# Patient Record
Sex: Female | Born: 1945 | Race: White | Hispanic: No | Smoking: Never smoker
Health system: Southern US, Community
[De-identification: ages and names within clinical notes are randomized; demographics above are authoritative.]

## PROBLEM LIST (undated history)

## (undated) DIAGNOSIS — E119 Type 2 diabetes mellitus without complications: Secondary | ICD-10-CM

## (undated) DIAGNOSIS — A809 Acute poliomyelitis, unspecified: Secondary | ICD-10-CM

## (undated) DIAGNOSIS — M1990 Unspecified osteoarthritis, unspecified site: Secondary | ICD-10-CM

## (undated) DIAGNOSIS — I10 Essential (primary) hypertension: Secondary | ICD-10-CM

---

## 1996-10-17 HISTORY — PX: BREAST SURGERY: SHX581

## 2000-02-15 ENCOUNTER — Other Ambulatory Visit: Admission: RE | Admit: 2000-02-15 | Discharge: 2000-02-15 | Payer: Self-pay | Admitting: Family Medicine

## 2000-09-05 ENCOUNTER — Encounter: Admission: RE | Admit: 2000-09-05 | Discharge: 2000-09-05 | Payer: Self-pay | Admitting: Family Medicine

## 2000-09-05 ENCOUNTER — Encounter: Payer: Self-pay | Admitting: Family Medicine

## 2001-05-14 ENCOUNTER — Other Ambulatory Visit: Admission: RE | Admit: 2001-05-14 | Discharge: 2001-05-14 | Payer: Self-pay | Admitting: Family Medicine

## 2001-08-11 ENCOUNTER — Encounter: Payer: Self-pay | Admitting: Emergency Medicine

## 2001-08-12 ENCOUNTER — Inpatient Hospital Stay (HOSPITAL_COMMUNITY): Admission: EM | Admit: 2001-08-12 | Discharge: 2001-08-12 | Payer: Self-pay | Admitting: Emergency Medicine

## 2002-09-09 ENCOUNTER — Other Ambulatory Visit: Admission: RE | Admit: 2002-09-09 | Discharge: 2002-09-09 | Payer: Self-pay | Admitting: Family Medicine

## 2003-09-26 ENCOUNTER — Other Ambulatory Visit: Admission: RE | Admit: 2003-09-26 | Discharge: 2003-09-26 | Payer: Self-pay | Admitting: Family Medicine

## 2004-11-18 ENCOUNTER — Other Ambulatory Visit: Admission: RE | Admit: 2004-11-18 | Discharge: 2004-11-18 | Payer: Self-pay | Admitting: Family Medicine

## 2004-12-27 ENCOUNTER — Ambulatory Visit (HOSPITAL_COMMUNITY): Admission: RE | Admit: 2004-12-27 | Discharge: 2004-12-27 | Payer: Self-pay | Admitting: Surgery

## 2004-12-27 ENCOUNTER — Encounter (INDEPENDENT_AMBULATORY_CARE_PROVIDER_SITE_OTHER): Payer: Self-pay | Admitting: *Deleted

## 2004-12-27 ENCOUNTER — Ambulatory Visit (HOSPITAL_BASED_OUTPATIENT_CLINIC_OR_DEPARTMENT_OTHER): Admission: RE | Admit: 2004-12-27 | Discharge: 2004-12-27 | Payer: Self-pay | Admitting: Surgery

## 2005-06-02 ENCOUNTER — Emergency Department (HOSPITAL_COMMUNITY): Admission: EM | Admit: 2005-06-02 | Discharge: 2005-06-02 | Payer: Self-pay | Admitting: Emergency Medicine

## 2005-12-01 ENCOUNTER — Other Ambulatory Visit: Admission: RE | Admit: 2005-12-01 | Discharge: 2005-12-01 | Payer: Self-pay | Admitting: Family Medicine

## 2007-02-27 ENCOUNTER — Encounter: Admission: RE | Admit: 2007-02-27 | Discharge: 2007-02-27 | Payer: Self-pay | Admitting: Family Medicine

## 2007-03-20 ENCOUNTER — Encounter: Admission: RE | Admit: 2007-03-20 | Discharge: 2007-03-20 | Payer: Self-pay | Admitting: Family Medicine

## 2007-03-29 ENCOUNTER — Encounter: Admission: RE | Admit: 2007-03-29 | Discharge: 2007-03-29 | Payer: Self-pay | Admitting: Family Medicine

## 2007-04-01 ENCOUNTER — Encounter: Admission: RE | Admit: 2007-04-01 | Discharge: 2007-04-01 | Payer: Self-pay | Admitting: Family Medicine

## 2007-05-14 ENCOUNTER — Other Ambulatory Visit: Admission: RE | Admit: 2007-05-14 | Discharge: 2007-05-14 | Payer: Self-pay | Admitting: Family Medicine

## 2008-05-26 IMAGING — CR DG KNEE 1-2V*R*
2 series · 2 of 2 positions shown · non-contrast
Comparison: None.

Exam: Right knee, 2 views.

HISTORY: Right knee pain.

[view not recorded (1 of 2)]
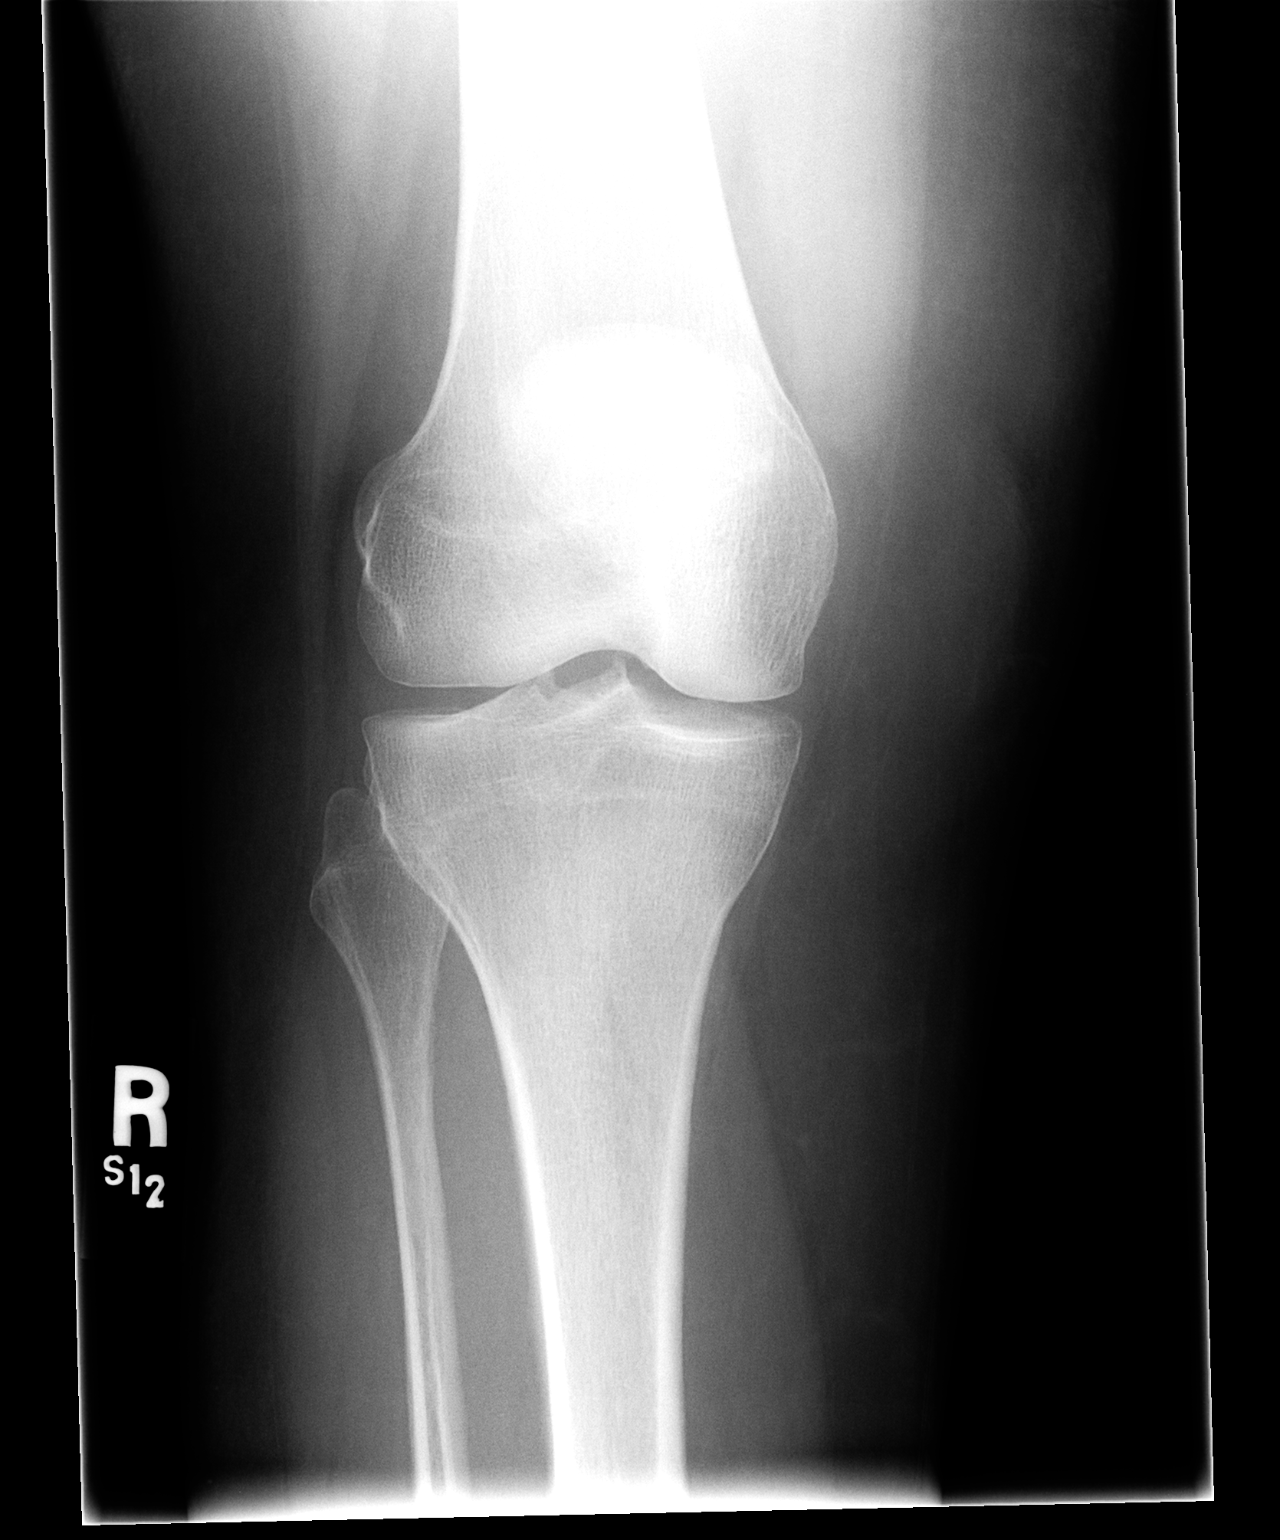

[view not recorded (2 of 2)]
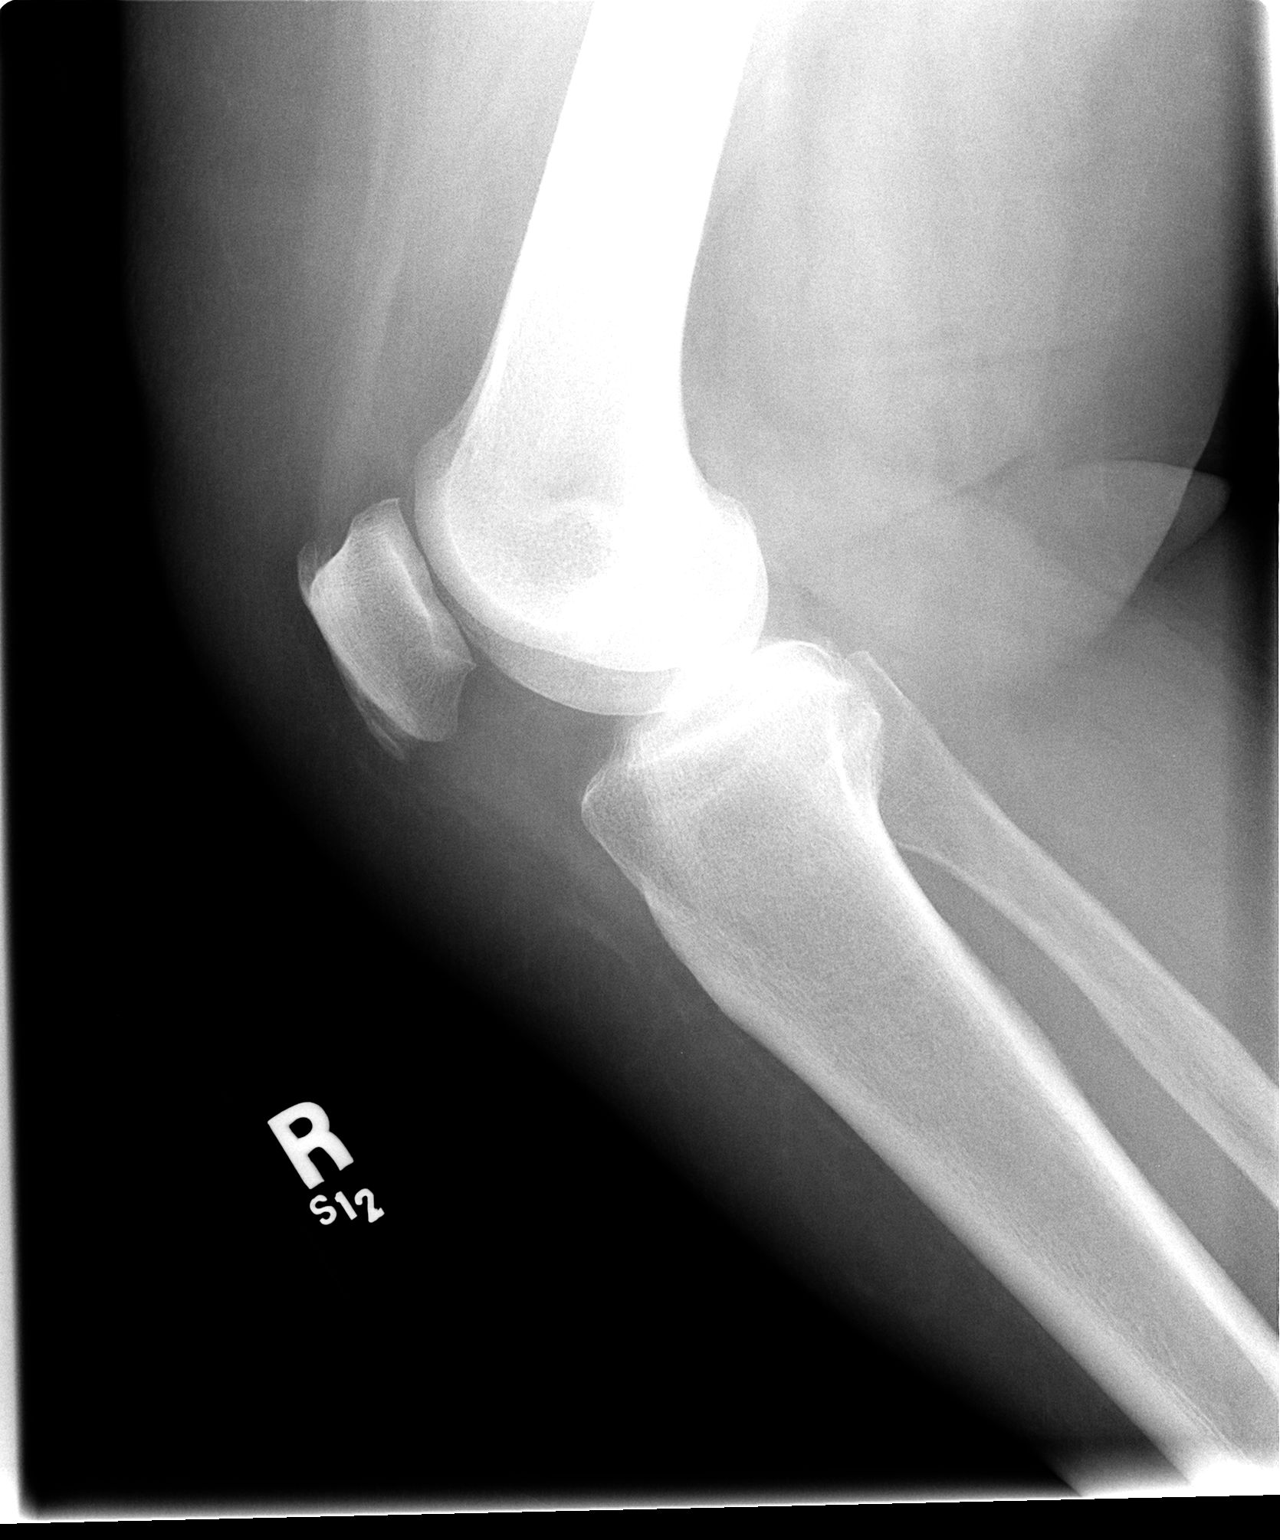

[2 of 2 positions shown; findings below may reference images not displayed]

FINDINGS: There is no joint effusion.

No fractures or dislocations noted.

Sharpening of the tibial spines noted.
IMPRESSION: 1. Mild degenerative joint disease.

## 2008-06-16 IMAGING — US US EXTREM LOW VENOUS*R*
1 series · 14 of 24 positions shown · non-contrast
Comparison: None.

CLINICAL DATA: Right lower extremity pain.
 RIGHT LOWER EXTREMITY VENOUS DOPPLER ULTRASOUND:
TECHNIQUE: Gray scale sonography with compression, as well as color and duplex Doppler ultrasound, were performed to evaluate the deep venous system from the level of the common femoral vein through the popliteal and proximal calf veins.

[Series 1: us extrem low venous*right* · 14 of 30 slices shown]
[im 1/30]
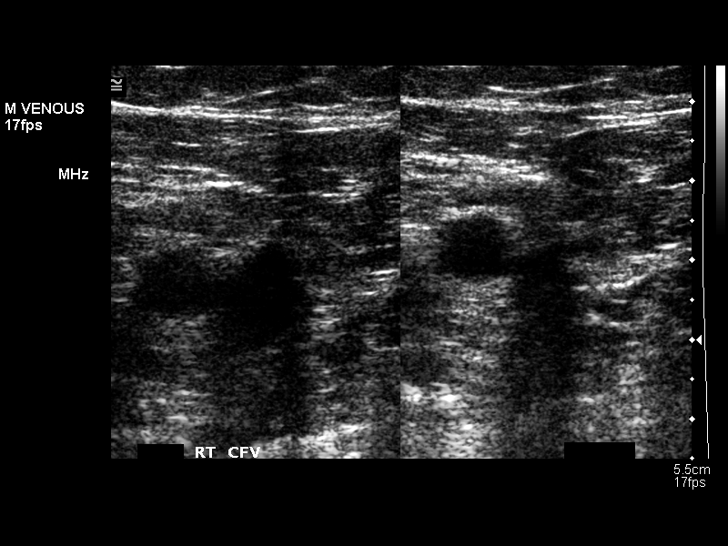
[im 3/30]
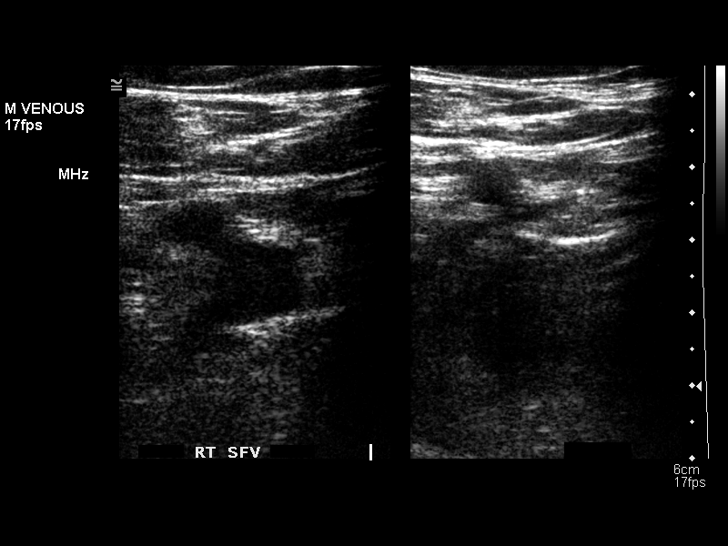
[im 6/30]
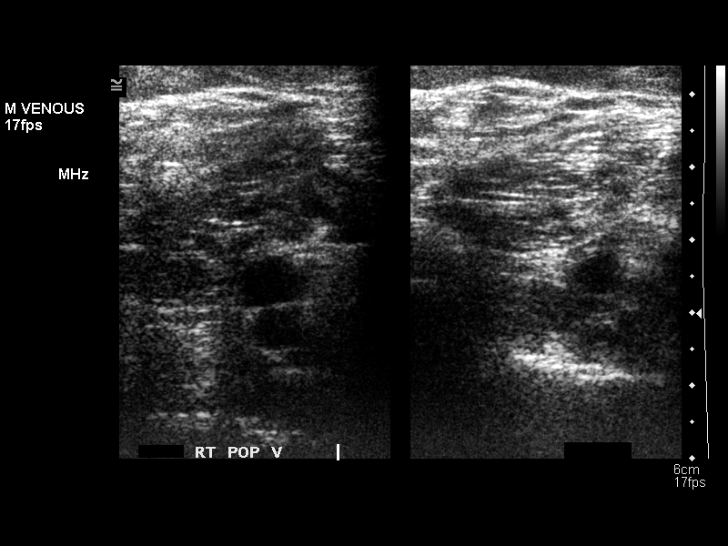
[im 8/30]
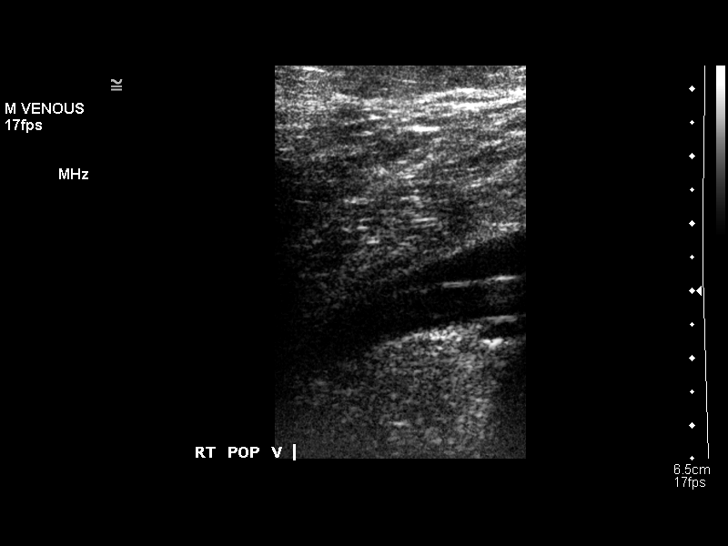
[im 9/30]
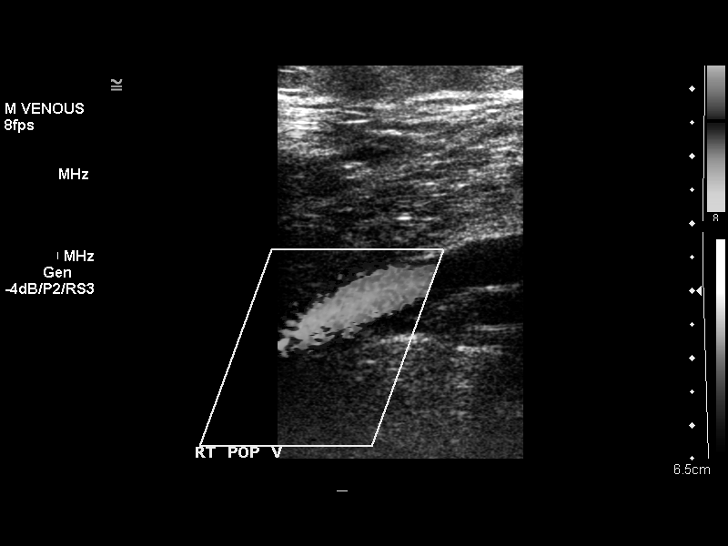
[im 12/30]
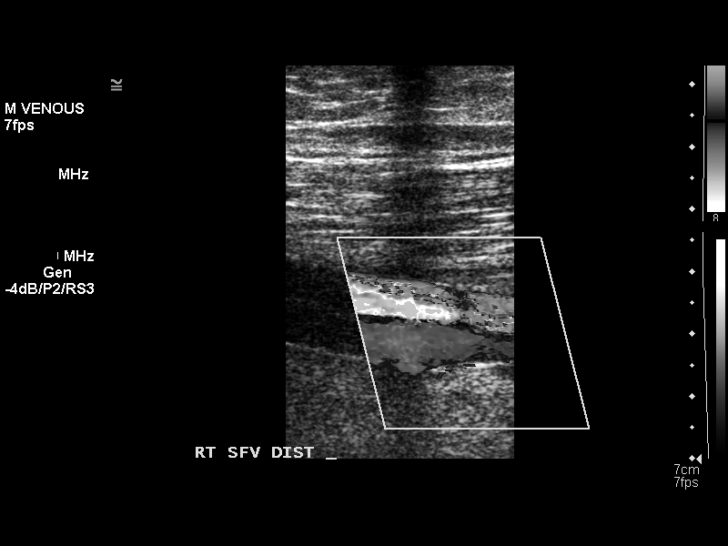
[im 14/30]
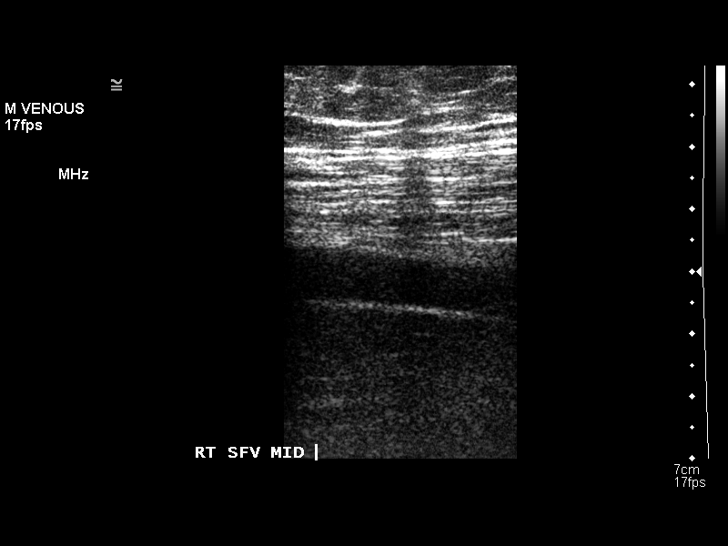
[im 16/30]
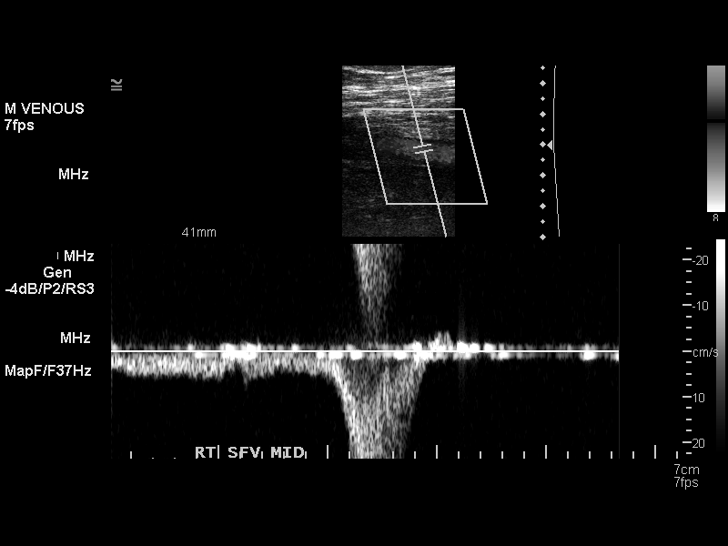
[im 18/30]
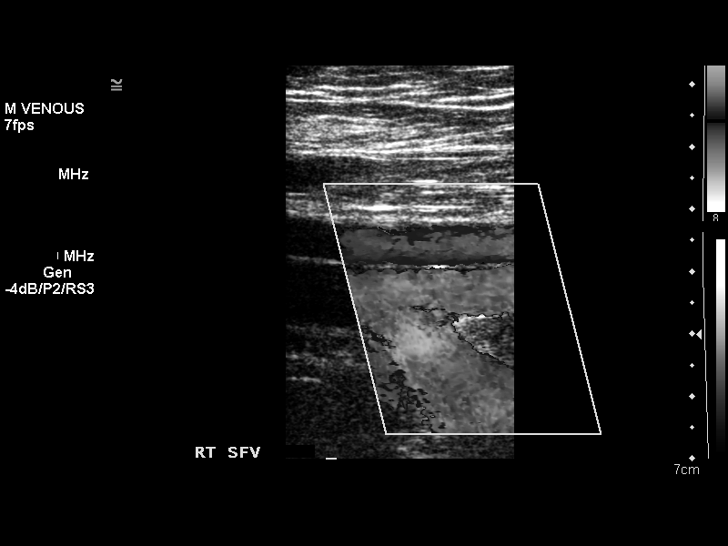
[im 21/30]
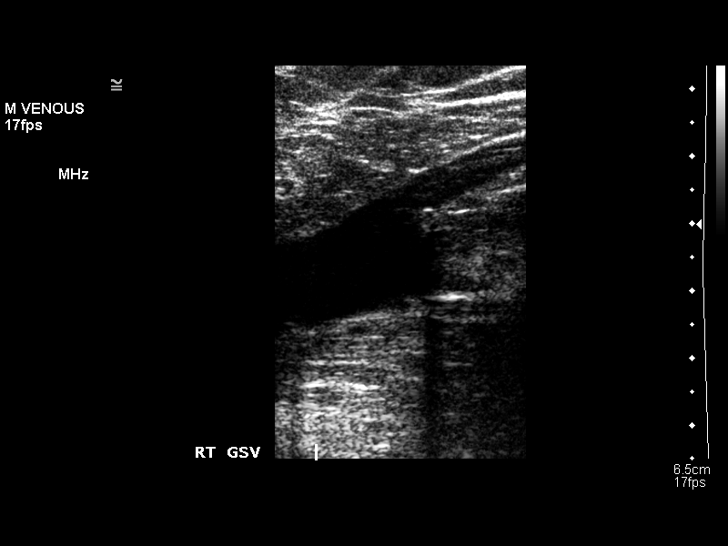
[im 23/30]
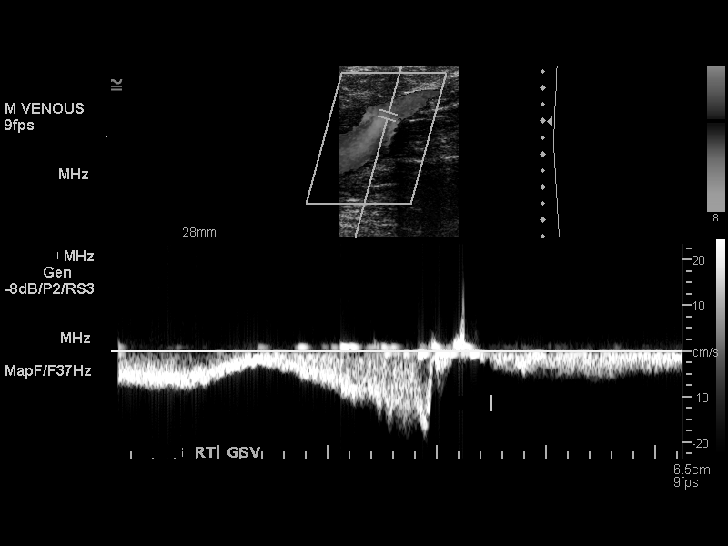
[im 24/30]
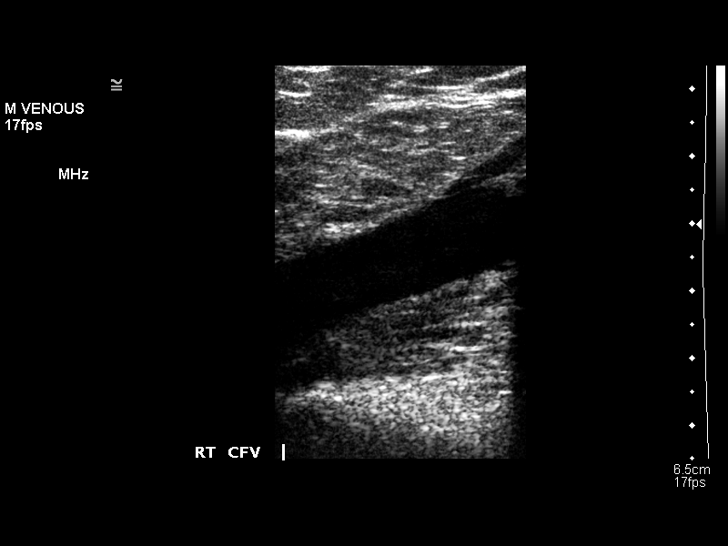
[im 27/30]
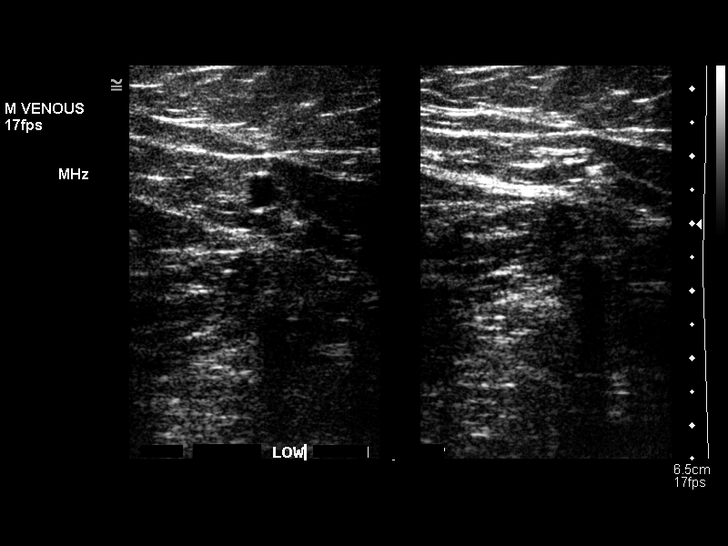
[im 30/30]
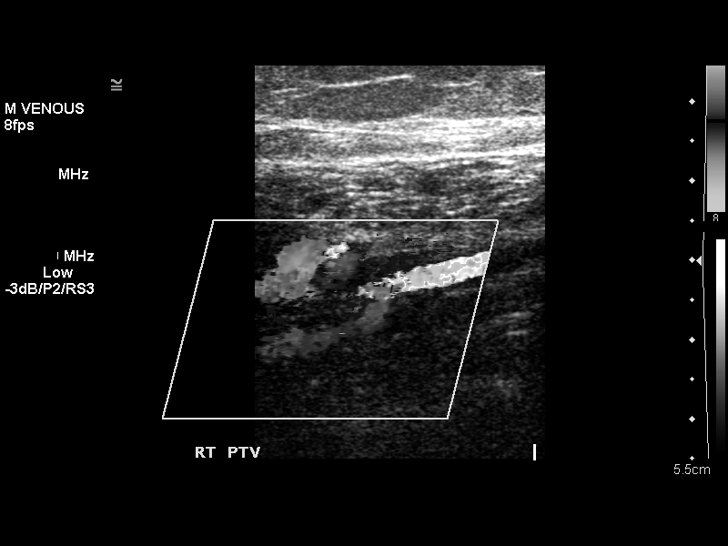

[14 of 24 positions shown; findings below may reference images not displayed]

FINDINGS: Complete compressibility is demonstrated throughout the visualized deep veins.  No venous filling defects are identified by gray-scale or color Doppler sonography.  Doppler waveforms show normal direction of venous flow, with normal phasicity and response to augmentation.
IMPRESSION: Negative.  No evidence of deep venous thrombosis.

## 2008-06-25 IMAGING — US US EXTREM LOW VENOUS*R*
1 series · 14 of 24 positions shown · non-contrast
Comparison: none

CLINICAL DATA: Worsening right leg pain and swelling

 Right lower extremity venous Doppler ultrasound:
TECHNIQUE: Gray-scale sonography with compression as well as color and duplex
Doppler ultrasound were performed to evaluate the deep venous system from the
level of the common femoral vein through the popliteal and proximal calf veins.

[Series 1: us extrem low venous*right* · 14 of 33 slices shown]
[im 1/33]
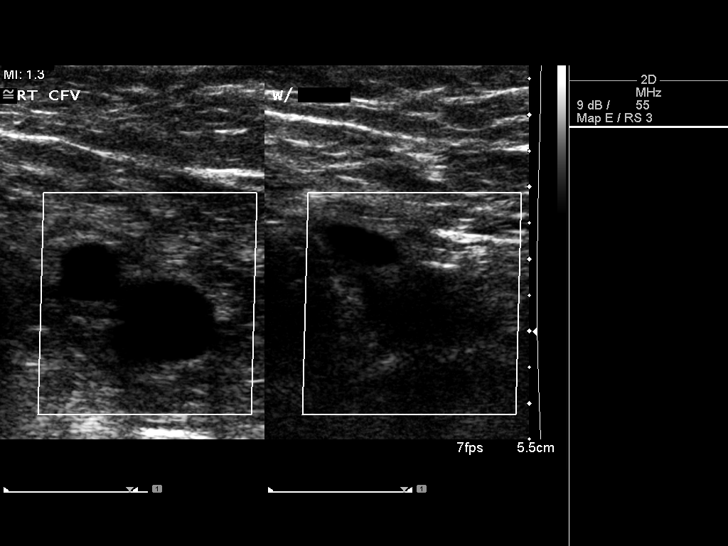
[im 3/33]
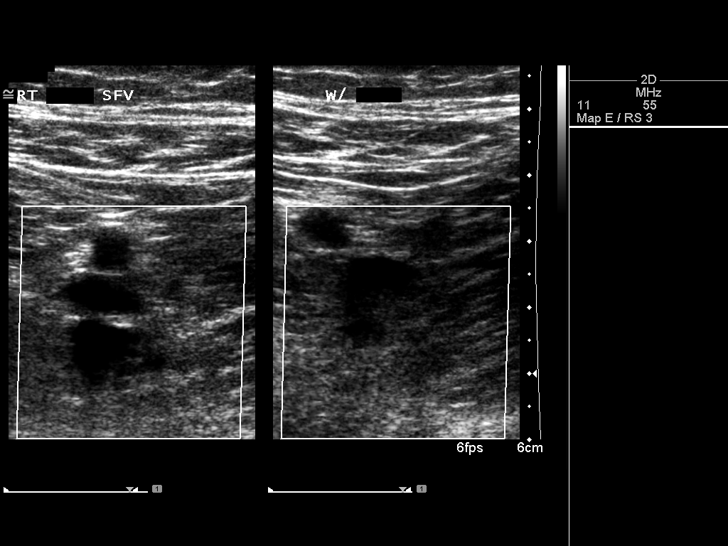
[im 6/33]
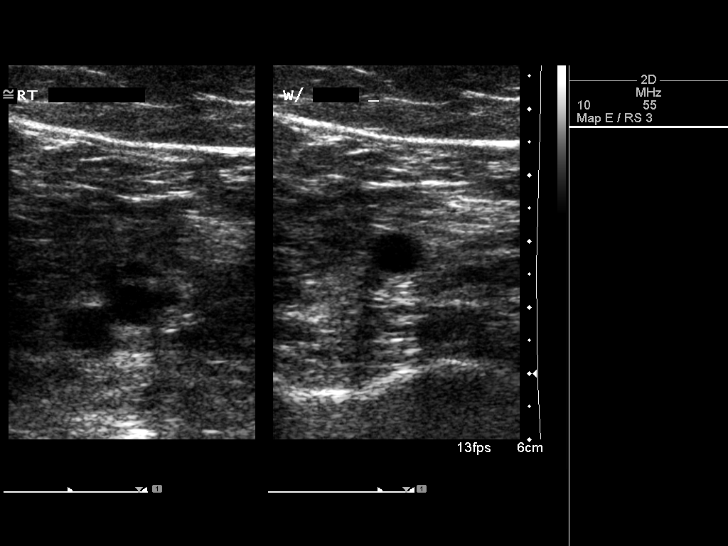
[im 9/33]
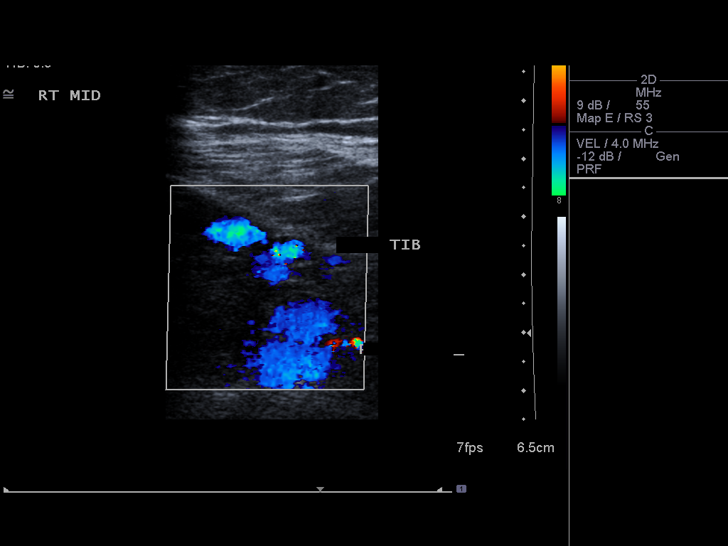
[im 10/33]
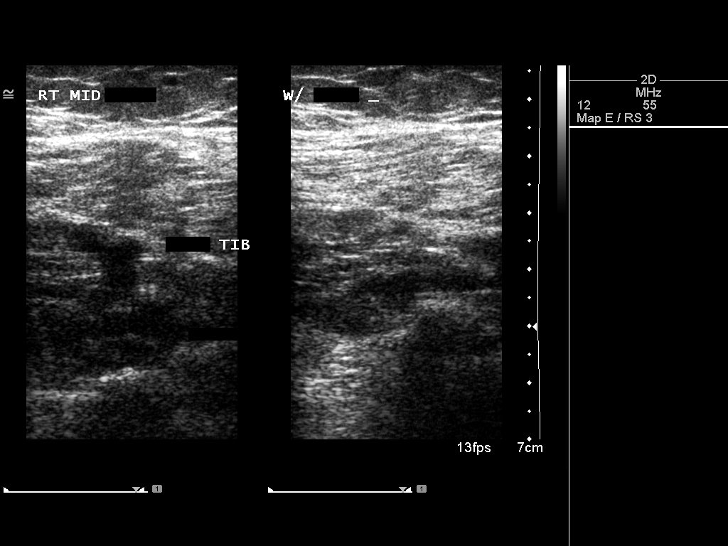
[im 13/33]
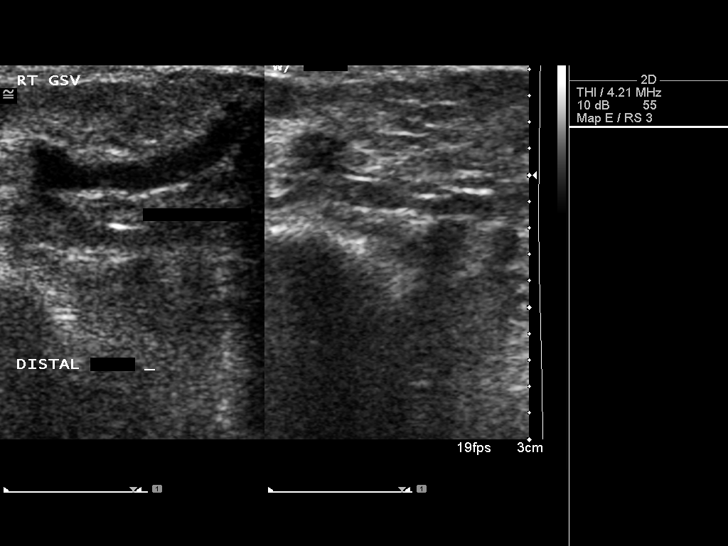
[im 16/33]
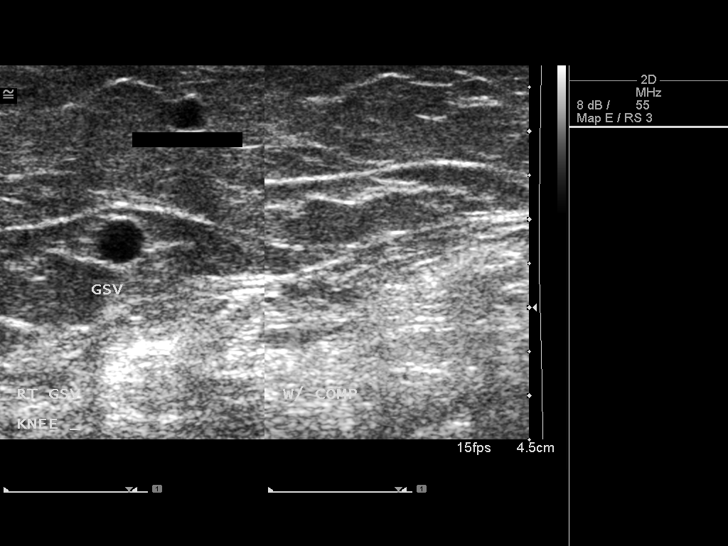
[im 17/33]
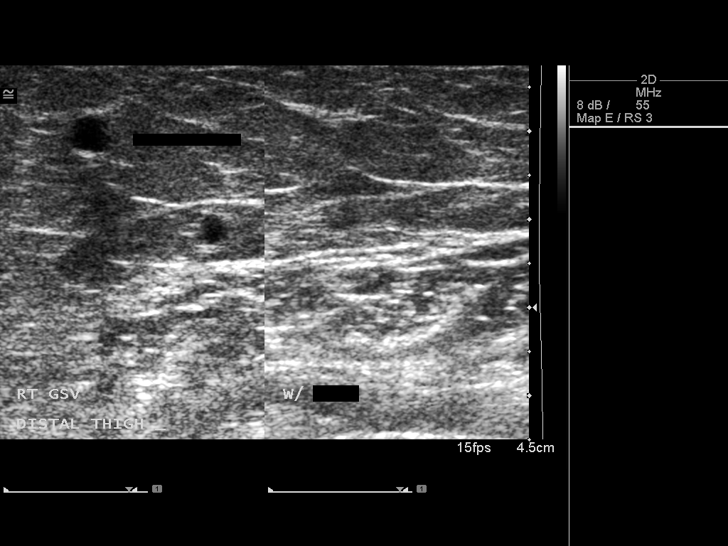
[im 20/33]
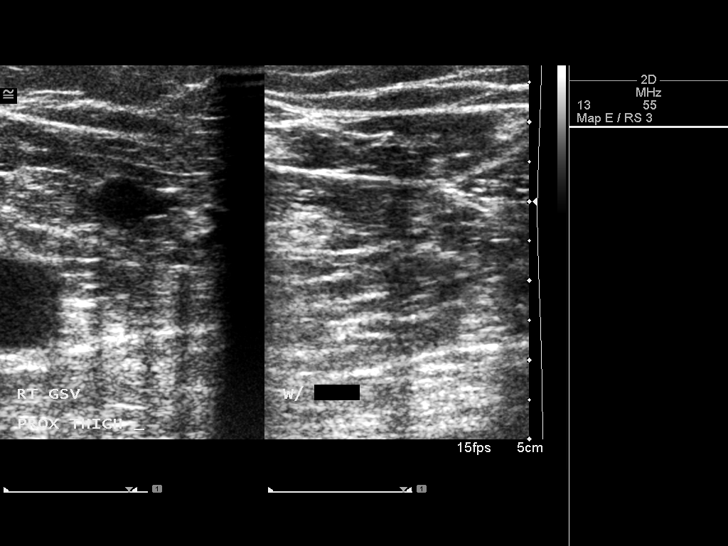
[im 23/33]
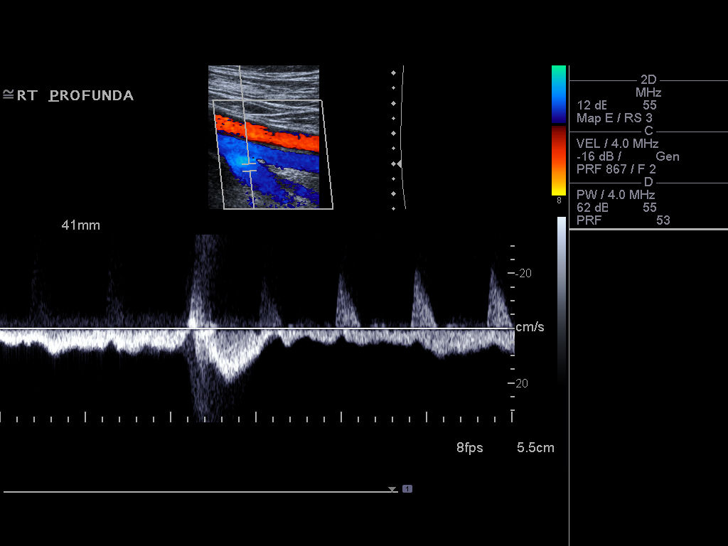
[im 26/33]
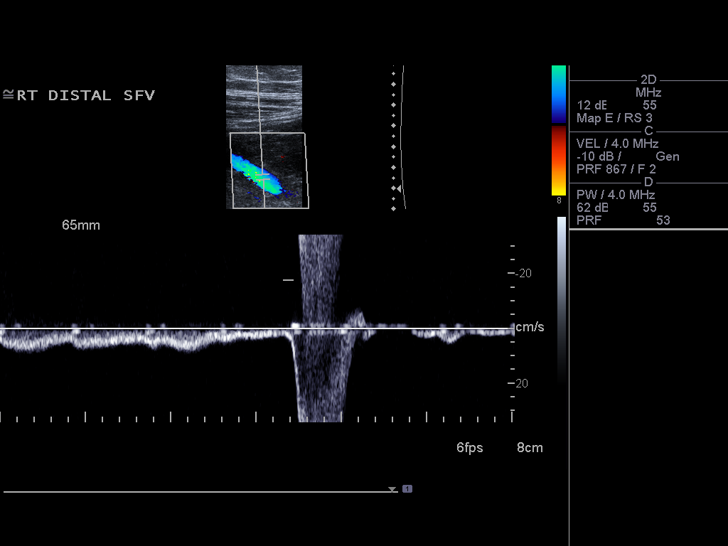
[im 27/33]
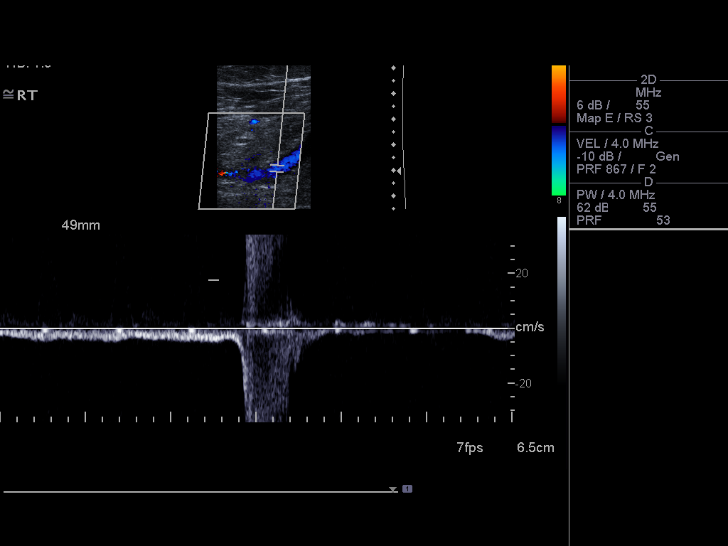
[im 30/33]
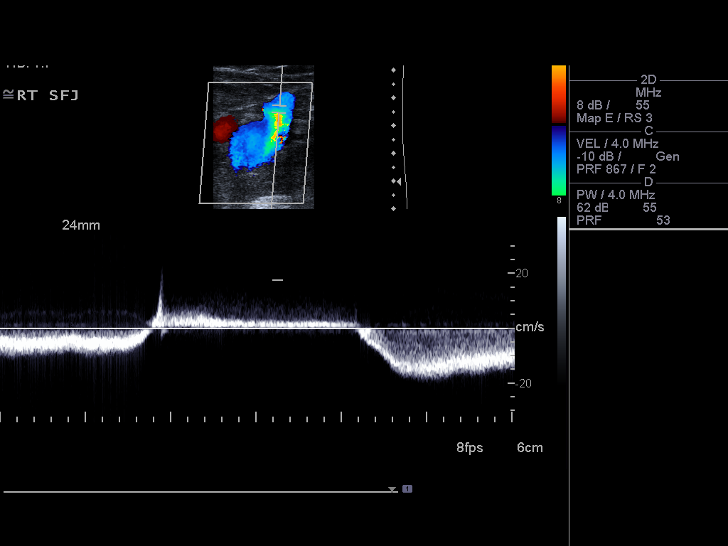
[im 33/33]
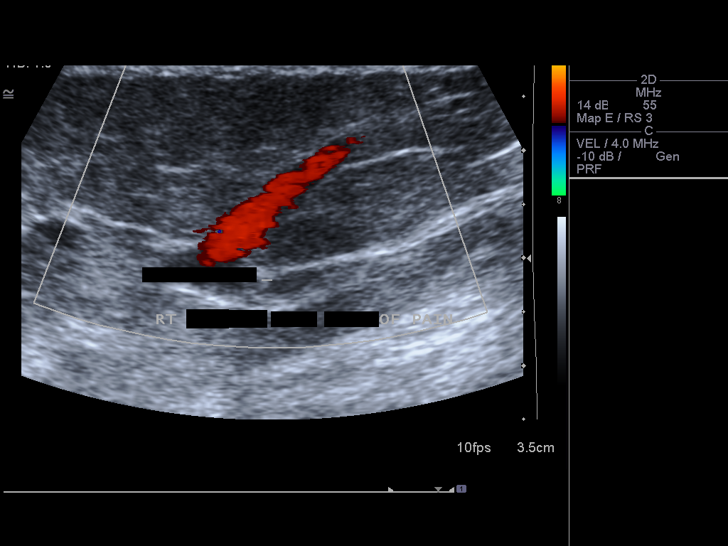

[14 of 24 positions shown; findings below may reference images not displayed]

FINDINGS: Comparison 03/20/2007.  The lower extremity deep venous system
demonstrates normal compressibility, phasicity, and augmentation. Posterior
tibial vein unremarkable.  No evidence of DVT. Varicose veins are noted in the
calf. Reflux noted at the saphenofemoral junction when supine with Valsalva
maneuver.
IMPRESSION: 1. Negative for right lower extremity DVT.
2. Right calf varicose veins and venous reflux. If symptoms persist, consider
interventional radiology consultation regarding treatment options.

## 2009-06-26 ENCOUNTER — Other Ambulatory Visit: Admission: RE | Admit: 2009-06-26 | Discharge: 2009-06-26 | Payer: Self-pay | Admitting: Geriatric Medicine

## 2011-03-04 NOTE — Op Note (Signed)
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   OPERATION:  Removal left breast mass.   SURGEON:  Currie Paris, M.D.   ANESTHESIA:  General.   CLINICAL COURSE:  This is a 65 year old with a recently developed left  breast mass in the upper outer quadrant. A core biopsy was inconclusive.  After discussion with the patient, it was elected to go to the operating  room for excision.   DESCRIPTION OF PROCEDURE:  The patient seen in the holding area and she had  no further questions. The left breast was marked as the operative site.   She was taken to the operating room and prior to being given any IV  sedation, the mass itself was identified by palpation and marked. At that  point satisfactory general anesthesia was obtained and the breast prepped  and draped. The time-out occurred to confirm correct patient, etc..   I infiltrated 0.25% plain Marcaine around the mass and under the overlying  skin. I made a curvilinear incision and divided some subcutaneous tissues  and using cautery tried to get around the mass with a little bit of a margin  in all directions but this was a large mass and I did get into once with the  cautery. It was close to the chest wall posteriorly and superiorly so those  margins were somewhat close but the rest had a small rim of normal fatty or  breast tissue  around. Once we had control of any bleeding, I infiltrated additional 0.25%  plain Marcaine to help  with postop analgesia. The breast was closed in  layers with 3-0 Vicryl followed by 4-0 Monocryl subcuticular plus Dermabond.   The patient tolerated procedure well. No complications and all counts were  correct.      CJS/MEDQ  D:  12/27/2004  T:  12/27/2004  Job:  [id number]   cc:   Quita Skye. Artis Flock, M.D.  [address]  [place]  [place] [address]  Fax: [phone]   Jeralyn Ruths, M.D.

## 2012-08-09 ENCOUNTER — Other Ambulatory Visit: Payer: Self-pay | Admitting: Nurse Practitioner

## 2012-08-09 ENCOUNTER — Other Ambulatory Visit (HOSPITAL_COMMUNITY)
Admission: RE | Admit: 2012-08-09 | Discharge: 2012-08-09 | Disposition: A | Discharge status: Home / Self Care | Payer: Medicare Other | Source: Ambulatory Visit | Attending: Obstetrics and Gynecology | Admitting: Obstetrics and Gynecology

## 2012-08-09 DIAGNOSIS — Z01419 Encounter for gynecological examination (general) (routine) without abnormal findings: Secondary | ICD-10-CM | POA: Insufficient documentation

## 2012-08-09 DIAGNOSIS — Z1151 Encounter for screening for human papillomavirus (HPV): Secondary | ICD-10-CM | POA: Insufficient documentation

## 2014-06-30 ENCOUNTER — Other Ambulatory Visit: Payer: Self-pay | Admitting: Gastroenterology

## 2014-09-18 ENCOUNTER — Encounter (HOSPITAL_COMMUNITY): Payer: Self-pay | Admitting: *Deleted

## 2014-09-29 ENCOUNTER — Other Ambulatory Visit: Payer: Self-pay | Admitting: Gastroenterology

## 2014-09-30 ENCOUNTER — Ambulatory Visit (HOSPITAL_COMMUNITY): Payer: Medicare Other | Admitting: Anesthesiology

## 2014-09-30 ENCOUNTER — Ambulatory Visit (HOSPITAL_COMMUNITY)
Admission: RE | Admit: 2014-09-30 | Discharge: 2014-09-30 | Disposition: A | Discharge status: Home / Self Care | Payer: Medicare Other | Source: Ambulatory Visit | Attending: Gastroenterology | Admitting: Gastroenterology

## 2014-09-30 ENCOUNTER — Encounter (HOSPITAL_COMMUNITY)
Admission: RE | Disposition: A | Discharge status: Home / Self Care | Payer: Self-pay | Source: Ambulatory Visit | Attending: Gastroenterology

## 2014-09-30 ENCOUNTER — Encounter (HOSPITAL_COMMUNITY): Payer: Self-pay | Admitting: *Deleted

## 2014-09-30 DIAGNOSIS — M8580 Other specified disorders of bone density and structure, unspecified site: Secondary | ICD-10-CM | POA: Insufficient documentation

## 2014-09-30 DIAGNOSIS — I10 Essential (primary) hypertension: Secondary | ICD-10-CM | POA: Insufficient documentation

## 2014-09-30 DIAGNOSIS — M1990 Unspecified osteoarthritis, unspecified site: Secondary | ICD-10-CM | POA: Insufficient documentation

## 2014-09-30 DIAGNOSIS — K5730 Diverticulosis of large intestine without perforation or abscess without bleeding: Secondary | ICD-10-CM | POA: Insufficient documentation

## 2014-09-30 DIAGNOSIS — Z1211 Encounter for screening for malignant neoplasm of colon: Secondary | ICD-10-CM | POA: Insufficient documentation

## 2014-09-30 DIAGNOSIS — Z86010 Personal history of colonic polyps: Secondary | ICD-10-CM | POA: Insufficient documentation

## 2014-09-30 DIAGNOSIS — E780 Pure hypercholesterolemia: Secondary | ICD-10-CM | POA: Insufficient documentation

## 2014-09-30 DIAGNOSIS — E039 Hypothyroidism, unspecified: Secondary | ICD-10-CM | POA: Diagnosis not present

## 2014-09-30 HISTORY — PX: COLONOSCOPY WITH PROPOFOL: SHX5780

## 2014-09-30 HISTORY — DX: Unspecified osteoarthritis, unspecified site: M19.90

## 2014-09-30 HISTORY — DX: Acute poliomyelitis, unspecified: A80.9

## 2014-09-30 HISTORY — DX: Type 2 diabetes mellitus without complications: E11.9

## 2014-09-30 HISTORY — DX: Essential (primary) hypertension: I10

## 2014-09-30 LAB — GLUCOSE, CAPILLARY: GLUCOSE-CAPILLARY: 102 mg/dL — AB (ref 70–99)

## 2014-09-30 SURGERY — COLONOSCOPY WITH PROPOFOL
Anesthesia: Monitor Anesthesia Care

## 2014-09-30 MED ORDER — LACTATED RINGERS IV SOLN
INTRAVENOUS | Status: DC | PRN
  Administered 2014-09-30: 07:00:00 via INTRAVENOUS

## 2014-09-30 MED ORDER — PROPOFOL 10 MG/ML IV BOLUS
INTRAVENOUS | Status: DC | PRN
  Administered 2014-09-30: 50 mg via INTRAVENOUS

## 2014-09-30 MED ORDER — PROPOFOL 10 MG/ML IV BOLUS
INTRAVENOUS | Status: AC
  Filled 2014-09-30: qty 20

## 2014-09-30 MED ORDER — LIDOCAINE HCL (CARDIAC) 20 MG/ML IV SOLN
INTRAVENOUS | Status: DC | PRN
  Administered 2014-09-30: 50 mg via INTRAVENOUS

## 2014-09-30 MED ORDER — PROPOFOL INFUSION 10 MG/ML OPTIME
INTRAVENOUS | Status: DC | PRN
  Administered 2014-09-30: 120 ug/kg/min via INTRAVENOUS

## 2014-09-30 SURGICAL SUPPLY — 21 items

## 2014-09-30 NOTE — Anesthesia Postprocedure Evaluation (Signed)
Anesthesia Post Note  Patient: Beverly Cowan  Procedure(s) Performed: Procedure(s) (LRB): COLONOSCOPY WITH PROPOFOL (N/A)  Anesthesia type: MAC  Patient location: PACU  Post pain: Pain level controlled  Post assessment: Post-op Vital signs reviewed  Last Vitals: BP 133/63 mmHg  Pulse 70  Temp(Src) 36.5 C (Oral)  Resp 23  Ht 5' 5.5" (1.664 m)  Wt 165 lb (74.844 kg)  BMI 27.03 kg/m2  SpO2 98%  Post vital signs: Reviewed  Level of consciousness: awake  Complications: No apparent anesthesia complications

## 2014-09-30 NOTE — Op Note (Signed)
Procedure: Surveillance colonoscopy. 12/23/2008 colonoscopy with removal of a tubulovillous rectal adenoma  Endoscopist: Danise EdgeMartin Sherine Cortese  Premedication: Propofol administered by anesthesia  Procedure: The patient was placed in the left lateral decubitus position. Anal inspection and digital rectal exam were normal. The Pentax pediatric colonoscope was introduced into the rectum and advanced to the cecum. A normal-appearing appendiceal orifice was identified. A normal-appearing ileocecal valve was identified. Colonic preparation for the exam today was good. Withdrawal time was 10 minutes  Rectum. Normal. Retroflexed view of the distal rectum normal  Sigmoid colon and descending colon. Left colonic diverticulosis  Splenic flexure. Normal  Transverse colon. Normal  Hepatic flexure. Normal  Ascending colon. Normal  Cecum and ileocecal valve. Normal  Assessment: Normal surveillance colonoscopy  Recommendation: Schedule repeat surveillance colonoscopy in 5 years

## 2014-09-30 NOTE — Discharge Instructions (Signed)

## 2014-09-30 NOTE — Anesthesia Preprocedure Evaluation (Signed)
Anesthesia Evaluation  Patient identified by MRN, date of birth, ID band Patient awake    Reviewed: Allergy & Precautions, H&P , NPO status , Patient's Chart, lab work & pertinent test results  Airway Mallampati: II  TM Distance: >3 FB Neck ROM: Full    Dental no notable dental hx.    Pulmonary neg pulmonary ROS,  breath sounds clear to auscultation  Pulmonary exam normal       Cardiovascular hypertension, Pt. on medications Rhythm:Regular Rate:Normal     Neuro/Psych negative neurological ROS  negative psych ROS   GI/Hepatic negative GI ROS, Neg liver ROS,   Endo/Other  diabetes, Type 2, Oral Hypoglycemic Agents  Renal/GU negative Renal ROS     Musculoskeletal negative musculoskeletal ROS (+) Arthritis -,   Abdominal   Peds  Hematology negative hematology ROS (+)   Anesthesia Other Findings   Reproductive/Obstetrics negative OB ROS                             Anesthesia Physical Anesthesia Plan  ASA: II  Anesthesia Plan: MAC   Post-op Pain Management:    Induction: Intravenous  Airway Management Planned:   Additional Equipment:   Intra-op Plan:   Post-operative Plan:   Informed Consent: I have reviewed the patients History and Physical, chart, labs and discussed the procedure including the risks, benefits and alternatives for the proposed anesthesia with the patient or authorized representative who has indicated his/her understanding and acceptance.   Dental advisory given  Plan Discussed with: CRNA  Anesthesia Plan Comments:         Anesthesia Quick Evaluation

## 2014-09-30 NOTE — Transfer of Care (Signed)
Immediate Anesthesia Transfer of Care Note  Patient: Beverly SpangleMary C Liebig  Procedure(s) Performed: Procedure(s): COLONOSCOPY WITH PROPOFOL (N/A)  Patient Location: PACU  Anesthesia Type:MAC  Level of Consciousness: awake, alert  and oriented  Airway & Oxygen Therapy: Patient Spontanous Breathing and Patient connected to face mask oxygen  Post-op Assessment: Report given to PACU RN and Post -op Vital signs reviewed and stable  Post vital signs: Reviewed and stable  Complications: No apparent anesthesia complications

## 2014-09-30 NOTE — H&P (Signed)
  Procedure: Surveillance colonoscopy. 12/23/2008 colonoscopy with removal of a tubulovillous rectal adenoma  History: The patient is a 68 year old female born 05/16/1946. She is scheduled to undergo a surveillance colonoscopy today.  Past medical history: Type 2 diabetes mellitus diagnosed in 2010. Hypothyroidism. Osteoarthritis. Hypertension. Polio as an infant. Osteopenia. Hypercholesterolemia.  Medication allergies: Penicillin. Sulfa. Atorvastatin.  Exam: The patient is alert and lying comfortably on the endoscopy stretcher. Abdomen is soft and nontender to palpation. Lungs are clear to auscultation. Cardiac exam reveals a regular rhythm.  Plan: Proceed with surveillance colonoscopy.

## 2014-10-01 ENCOUNTER — Encounter (HOSPITAL_COMMUNITY): Payer: Self-pay | Admitting: Gastroenterology

## 2016-08-25 ENCOUNTER — Other Ambulatory Visit: Payer: Self-pay | Admitting: Nurse Practitioner

## 2016-08-25 ENCOUNTER — Other Ambulatory Visit (HOSPITAL_COMMUNITY)
Admission: RE | Admit: 2016-08-25 | Discharge: 2016-08-25 | Disposition: A | Discharge status: Home / Self Care | Payer: Medicare Other | Source: Ambulatory Visit | Attending: Nurse Practitioner | Admitting: Nurse Practitioner

## 2016-08-25 DIAGNOSIS — Z01419 Encounter for gynecological examination (general) (routine) without abnormal findings: Secondary | ICD-10-CM | POA: Insufficient documentation

## 2016-08-25 DIAGNOSIS — Z1151 Encounter for screening for human papillomavirus (HPV): Secondary | ICD-10-CM | POA: Insufficient documentation

## 2016-08-29 LAB — CYTOLOGY - PAP
Diagnosis: NEGATIVE
HPV: NOT DETECTED

## 2017-10-04 LAB — HM DIABETES EYE EXAM

## 2017-10-13 ENCOUNTER — Encounter: Payer: Self-pay | Admitting: Family Medicine

## 2017-10-13 DIAGNOSIS — E119 Type 2 diabetes mellitus without complications: Secondary | ICD-10-CM

## 2017-10-13 NOTE — Progress Notes (Unsigned)
DM eye exam entered in wrong chart.

## 2019-09-18 ENCOUNTER — Other Ambulatory Visit: Payer: Self-pay

## 2020-01-13 ENCOUNTER — Ambulatory Visit: Payer: Medicare Other | Attending: Internal Medicine

## 2020-01-13 DIAGNOSIS — Z23 Encounter for immunization: Secondary | ICD-10-CM

## 2020-01-13 NOTE — Progress Notes (Signed)
   Covid-19 Vaccination Clinic  Name:  Beverly Cowan    MRN: [id number] DOB: Feb 14, 1946  01/13/2020  [name] was observed post Covid-19 immunization for 15 minutes without incident. She was provided with Vaccine Information Sheet and instruction to access the V-Safe system.   [name] was instructed to call 911 with any severe reactions post vaccine: Marland Kitchen Difficulty breathing  . Swelling of face and throat  . A fast heartbeat  . A bad rash all over body  . Dizziness and weakness   Immunizations Administered    Name Date Dose VIS Date Route   Pfizer COVID-19 Vaccine 01/13/2020 11:14 AM 0.3 mL 09/27/2019 Intramuscular   Manufacturer: [employer], [employer]   Lot: [id number]   NDC: [address]-4
